# Patient Record
Sex: Male | Born: 1950
Health system: Southern US, Community
[De-identification: ages and names within clinical notes are randomized; demographics above are authoritative.]

## PROBLEM LIST (undated history)

## (undated) DIAGNOSIS — Z9289 Personal history of other medical treatment: Secondary | ICD-10-CM

## (undated) DIAGNOSIS — Z86010 Personal history of colonic polyps: Secondary | ICD-10-CM

## (undated) DIAGNOSIS — I2510 Atherosclerotic heart disease of native coronary artery without angina pectoris: Principal | ICD-10-CM

## (undated) DIAGNOSIS — I700 Atherosclerosis of aorta: Secondary | ICD-10-CM

## (undated) DIAGNOSIS — E785 Hyperlipidemia, unspecified: Secondary | ICD-10-CM

## (undated) HISTORY — DX: Personal history of other medical treatment: Z92.89

## (undated) HISTORY — DX: Hyperlipidemia, unspecified: E78.5

## (undated) HISTORY — DX: Atherosclerosis of aorta: I70.0

## (undated) HISTORY — DX: Personal history of colonic polyps: Z86.010

## (undated) HISTORY — DX: Atherosclerotic heart disease of native coronary artery without angina pectoris: I25.10

---

## 2015-01-31 ENCOUNTER — Other Ambulatory Visit: Payer: Self-pay | Admitting: Family Medicine

## 2015-01-31 DIAGNOSIS — Z136 Encounter for screening for cardiovascular disorders: Secondary | ICD-10-CM

## 2015-03-02 ENCOUNTER — Telehealth: Payer: Self-pay | Admitting: *Deleted

## 2015-03-02 NOTE — Telephone Encounter (Signed)
Left Message to make Appointment x3 Sent letter  Dear Fred Figueroa, We have attempted to call you several times to schedule the lung screening Dr. London Pepper requested you have performed. We have been unable to contact you by phone. Please call the number below at your earliest convenience so that we can get you scheduled for your screening. We look forward to participating in your care.  Thank you,  [facility] [phone]

## 2016-02-08 ENCOUNTER — Other Ambulatory Visit: Payer: Self-pay | Admitting: Family Medicine

## 2016-02-08 DIAGNOSIS — Z87891 Personal history of nicotine dependence: Secondary | ICD-10-CM

## 2016-02-25 ENCOUNTER — Ambulatory Visit
Admission: RE | Admit: 2016-02-25 | Discharge: 2016-02-25 | Disposition: A | Discharge status: Home / Self Care | Payer: Medicare Other | Source: Ambulatory Visit | Attending: Family Medicine | Admitting: Family Medicine

## 2016-02-25 DIAGNOSIS — Z87891 Personal history of nicotine dependence: Secondary | ICD-10-CM

## 2016-03-12 ENCOUNTER — Telehealth: Payer: Self-pay | Admitting: Acute Care

## 2016-03-12 DIAGNOSIS — Z87891 Personal history of nicotine dependence: Secondary | ICD-10-CM

## 2016-03-13 NOTE — Telephone Encounter (Signed)
Pt returning call and can be reached @ [phone].Fred Figueroa

## 2016-03-13 NOTE — Telephone Encounter (Signed)
Spoke with pt and scheduled for [facility] 04/02/16 9:00 CT ordered Nothing further needed

## 2016-04-02 ENCOUNTER — Encounter: Payer: Self-pay | Admitting: Acute Care

## 2016-04-02 ENCOUNTER — Ambulatory Visit (INDEPENDENT_AMBULATORY_CARE_PROVIDER_SITE_OTHER)
Admission: RE | Admit: 2016-04-02 | Discharge: 2016-04-02 | Disposition: A | Discharge status: Home / Self Care | Payer: Medicare Other | Source: Ambulatory Visit | Attending: Acute Care | Admitting: Acute Care

## 2016-04-02 ENCOUNTER — Ambulatory Visit (INDEPENDENT_AMBULATORY_CARE_PROVIDER_SITE_OTHER): Payer: Medicare Other | Admitting: Acute Care

## 2016-04-02 DIAGNOSIS — Z87891 Personal history of nicotine dependence: Secondary | ICD-10-CM

## 2016-04-02 NOTE — Progress Notes (Signed)
Shared Decision Making Visit Lung Cancer Screening Program [phone])   Eligibility:  Age 66 y.o.  Pack Years Smoking History Calculation 43 pack year smoking history (# packs/per year x # years smoked)  Recent History of coughing up blood  no  Unexplained weight loss? no ( >Than 15 pounds within the last 6 months )  Prior History Lung / other cancer no (Diagnosis within the last 5 years already requiring surveillance chest CT Scans).  Smoking Status Former Smoker  Former Smokers: Years since quit: 2 years  Quit Date: 05/2014  Visit Components:  Discussion included one or more decision making aids. yes  Discussion included risk/benefits of screening. yes  Discussion included potential follow up diagnostic testing for abnormal scans. yes  Discussion included meaning and risk of over diagnosis. yes  Discussion included meaning and risk of False Positives. yes  Discussion included meaning of total radiation exposure. yes  Counseling Included:  Importance of adherence to annual lung cancer LDCT screening. yes  Impact of comorbidities on ability to participate in the program. yes  Ability and willingness to under diagnostic treatment. yes  Smoking Cessation Counseling:  Current Smokers:   Discussed importance of smoking cessation. yes  Information about tobacco cessation classes and interventions provided to patient. yes  Patient provided with "ticket" for LDCT Scan. yes  Symptomatic Patient. no  Counseling  Diagnosis Code: Tobacco Use Z72.0  Asymptomatic Patient yes  Counseling (Intermediate counseling: > three minutes counseling) M6294  Former Smokers:   Discussed the importance of maintaining cigarette abstinence. yes  Diagnosis Code: Personal History of Nicotine Dependence. T65.465  Information about tobacco cessation classes and interventions provided to patient. Yes  Patient provided with "ticket" for LDCT Scan. yes  Written Order for Lung Cancer  Screening with LDCT placed in Epic. Yes (CT Chest Lung Cancer Screening Low Dose W/O CM) [id number] Z12.2-Screening of respiratory organs Z87.891-Personal history of nicotine dependence  I spent 25 minutes of face to face time with Mr. Malloy discussing the risks and benefits of lung cancer screening. We viewed a power point together that explained in detail the above noted topics. We took the time to pause the power point at intervals to allow for questions to be asked and answered to ensure understanding. We discussed that he had taken the single most powerful action possible to decrease his risk of developing lung cancer when he quit smoking. I counseled him to remain smoke free, and to contact me if he ever had the desire to smoke again so that I can provide resources and tools to help support the effort to remain smoke free. We discussed the time and location of the scan, and that either Leigh or I will call with the results within  24-48 hours of receiving them. He has my card and contact information in the event he needs to speak with me, in addition to a copy of the power point we reviewed as a resource. Mr. Pooley verbalized understanding of all of the above and had no further questions upon leaving the office.   We spen 2 minutes discussing the need to maintain smoking abstinence.  We discussed the high incidence of CAD as an incidental finding on this exam, and that as a non-gated exam degree or severity could not be determined. Mr. Reister has his cholesterol checked annually, but does not take a statin. I told him we would fax a result of the scan to his PCP for follow up. He verbalized understanding.  Magdalen Spatz, NP 04/02/2016

## 2016-04-03 ENCOUNTER — Other Ambulatory Visit: Payer: Self-pay | Admitting: Acute Care

## 2016-04-03 DIAGNOSIS — Z87891 Personal history of nicotine dependence: Secondary | ICD-10-CM

## 2016-04-10 ENCOUNTER — Telehealth: Payer: Self-pay

## 2016-04-10 NOTE — Telephone Encounter (Signed)
SENT NOTES TO SCHEDULING 

## 2016-04-11 ENCOUNTER — Telehealth: Payer: Self-pay | Admitting: *Deleted

## 2016-04-11 NOTE — Telephone Encounter (Signed)
NOTES SENT TO SCHEDULING.  °

## 2016-05-09 ENCOUNTER — Encounter: Payer: Self-pay | Admitting: Physician Assistant

## 2016-05-09 ENCOUNTER — Ambulatory Visit (INDEPENDENT_AMBULATORY_CARE_PROVIDER_SITE_OTHER): Payer: Medicare Other | Admitting: Physician Assistant

## 2016-05-09 VITALS — BP 130/80 | HR 66 | Ht 74.0 in | Wt 179.8 lb

## 2016-05-09 DIAGNOSIS — E785 Hyperlipidemia, unspecified: Secondary | ICD-10-CM | POA: Diagnosis not present

## 2016-05-09 DIAGNOSIS — I700 Atherosclerosis of aorta: Secondary | ICD-10-CM | POA: Diagnosis not present

## 2016-05-09 DIAGNOSIS — I2510 Atherosclerotic heart disease of native coronary artery without angina pectoris: Secondary | ICD-10-CM | POA: Diagnosis not present

## 2016-05-09 HISTORY — DX: Atherosclerosis of aorta: I70.0

## 2016-05-09 HISTORY — DX: Atherosclerotic heart disease of native coronary artery without angina pectoris: I25.10

## 2016-05-09 NOTE — Progress Notes (Signed)
Cardiology Office Note:    Date:  05/09/2016   ID:  Fred Figueroa, DOB 1950/04/08, MRN [id number]  PCP:  London Pepper, MD  Cardiologist:  New - Dr. Sherren Mocha / Richardson Dopp, PA-C    Referring MD: London Pepper, MD   Chief Complaint  Patient presents with  . Coronary Atherosclerosis on Chest CT    History of Present Illness:    Fred Figueroa is a 66 y.o. male who is being seen today for the evaluation of coronary atherosclerosis noted on recent Chest CT at the request of London Pepper, MD.   He is here alone.  He does not have a hx of coronary artery disease or congestive heart failure.  He is a former smoker and underwent lung CA screening that demonstrated coronary and aortic atherosclerosis.  He denies any hx of chest pain or shortness of breath.  He denies syncope, orthopnea, PND, edema.    PAD Screen 05/09/2016  Previous PAD dx? No  Previous surgical procedure? No  Pain with walking? No  Feet/toe relief with dangling? No  Painful, non-healing ulcers? No  Extremities discolored? No    Prior CV studies:   The following studies were reviewed today:  Chest CT 4/18 IMPRESSION: 1. Lung-RADS Category 2, benign appearance or behavior. Continue annual screening with low-dose chest CT without contrast in 12 months. 2.  Emphysema. (ICD10-J43.9) 3. Coronary artery and thoracic aortic atherosclerosis.  AAA [place] 2/18 IMPRESSION: Negative for an abdominal aortic aneurysm.    Past Medical History:  Diagnosis Date  . Aortic atherosclerosis ([place]) 05/09/2016  . Coronary artery calcification seen on CT scan 05/09/2016  . Hx of colonic polyps   . Hyperlipidemia     History reviewed. No pertinent surgical history.  Current Medications: Current Meds  Medication Sig  . aspirin EC 81 MG tablet Take 81 mg by mouth daily.  . Lactobacillus (PROBIOTIC ACIDOPHILUS PO) Take by mouth. TAKE ONE TABLET BY MOUTH ONCE DAILY (OTC)  . Multiple Vitamin (MULTIVITAMIN) tablet Take 1 tablet by  mouth daily.  Marland Kitchen omega-3 acid ethyl esters (LOVAZA) 1 g capsule Take 1 g by mouth daily.     Allergies:   Patient has no allergy information on record.   Social History   Social History  . Marital status: Married    Spouse name: N/A  . Number of children: 2  . Years of education: N/A   Occupational History  . Software Developer [employer]   Social History Main Topics  . Smoking status: Former Smoker    Packs/day: 1.00    Years: 43.00    Types: Cigarettes    Quit date: 06/2014  . Smokeless tobacco: Never Used  . Alcohol use Yes     Comment: minimal  . Drug use: No  . Sexual activity: Not Asked   Other Topics Concern  . None   Social History Narrative   Native of [employer] certified [employer]   [place]   Married, 2 kids     Family History  Problem Relation Age of Onset  . Pancreatic cancer Mother   . Alzheimer's disease Father   . Heart attack Neg Hx      ROS:   Please see the history of present illness.    ROS All other systems reviewed and are negative.   EKGs/Labs/Other Test Reviewed:    EKG:  EKG is  ordered today.  The ekg ordered today demonstrates NSR, HR 66, normal  axis, inc RBBB, QTc 404 ms  Recent Labs: No results found for requested labs within last 8760 hours.  BUN 22.000 02/04/2016 Creatinine, Serum 1.110 02/04/2016 Albumin, serum 4.000 02/04/2016 ALT (SGPT) 21.000 02/04/2016 AST (SGOT) 18.000 02/04/2016 Bilirubin, Total 0.600 02/04/2016 Calcium 9.100 02/04/2016 Chloride 107.000 02/04/2016 Non-HDL 130.000 02/04/2016 Potassium 4.500 02/04/2016 Protein, blood 6.700 02/04/2016 Sodium 140.000 02/04/2016  Recent Lipid Panel No results found for: CHOL, TRIG, HDL, CHOLHDL, VLDL, LDLCALC, LDLDIRECT Lipid Panel completed 02/04/2016 HDL 29.000 02/04/2016 LDL 108.000 02/04/2016 Cholesterol, total 159.000 02/04/2016  Physical Exam:    VS:  BP 130/80 (BP Location: Left Arm, Patient Position: Sitting)   Pulse 66    Ht 6\' 2"  (1.88 m)   Wt 179 lb 12.8 oz (81.6 kg)   BMI 23.08 kg/m     Wt Readings from Last 3 Encounters:  05/09/16 179 lb 12.8 oz (81.6 kg)     Physical Exam  Constitutional: He is oriented to person, place, and time. He appears well-developed and well-nourished. No distress.  HENT:  Head: Normocephalic and atraumatic.  Eyes: No scleral icterus.  Neck: Normal range of motion. No JVD present. Carotid bruit is not present.  Cardiovascular: Normal rate, regular rhythm, S1 normal and S2 normal.   No murmur heard. Pulses:      Dorsalis pedis pulses are 2+ on the right side, and 2+ on the left side.       Posterior tibial pulses are 2+ on the right side, and 2+ on the left side.  Pulmonary/Chest: Effort normal and breath sounds normal. He has no wheezes. He has no rhonchi. He has no rales.  Abdominal: Soft. There is no tenderness.  Musculoskeletal: He exhibits no edema.  Neurological: He is alert and oriented to person, place, and time.  Skin: Skin is warm and dry.  Psychiatric: He has a normal mood and affect.    ASSESSMENT:    1. Coronary artery calcification seen on CT scan   2. Aortic atherosclerosis ([place])   3. Hyperlipidemia, unspecified hyperlipidemia type    PLAN:    In order of problems listed above:  1. Coronary artery calcification seen on CT scan -  He has no hx of chest pain and his ECG is basically normal aside from an incomplete RBBB.  He has a long hx of smoking.  We discussed proceeding with a GXT to screen for ischemic heart disease.  His 10 year ASCVD risk is 16.2% and I have recommended starting statin Rx.  He is already on an ASA.  -  He will consider statin Rx and contact us if he decides to proceed  -  Continue ASA  -  Arrange GXT  -  FU as needed unless GXT abnormal.  2. Aortic atherosclerosis ([facility]) - Continue ASA.  Recommend statin Rx.  He will consider this.   3. Hyperlipidemia, unspecified hyperlipidemia type - As noted, I have recommended statin  Rx.  He will consider this.    Dispo:  Return depending upon test results, for w/ Dr. Burt Knack or  Richardson Dopp, PA-C.   Medication Adjustments/Labs and Tests Ordered: Current medicines are reviewed at length with the patient today.  Concerns regarding medicines are outlined above.   Orders Placed This Encounter  Procedures  . Exercise Tolerance Test  . EKG 12-Lead    Signed, Richardson Dopp, PA-C  05/09/2016 11:15 AM    [facility] [address], [place], [place]  [address] Phone: [phone]; Fax: [phone]

## 2016-05-09 NOTE — Patient Instructions (Addendum)
Medication Instructions:  If you decide to start cholesterol treatment with a statin, call us.  Labwork: None   Testing/Procedures: 1. Your physician has requested that you have an echocardiogram. Echocardiography is a painless test that uses sound waves to create images of your heart. It provides your doctor with information about the size and shape of your heart and how well your heart's chambers and valves are working. This procedure takes approximately one hour. There are no restrictions for this procedure.  Follow-Up: Dr. Sherren Mocha or Richardson Dopp, PA-C as needed depending upon test results.   Any Other Special Instructions Will Be Listed Below (If Applicable).  If you need a refill on your cardiac medications before your next appointment, please call your pharmacy.

## 2016-05-23 ENCOUNTER — Ambulatory Visit (INDEPENDENT_AMBULATORY_CARE_PROVIDER_SITE_OTHER): Payer: Medicare Other

## 2016-05-23 DIAGNOSIS — I2510 Atherosclerotic heart disease of native coronary artery without angina pectoris: Secondary | ICD-10-CM

## 2016-05-23 LAB — EXERCISE TOLERANCE TEST
CHL RATE OF PERCEIVED EXERTION: 15
CSEPED: 6 min
CSEPEW: 7 METS
CSEPHR: 93 %
Exercise duration (sec): 0 s
MPHR: 154 {beats}/min
Peak HR: 144 {beats}/min
Rest HR: 80 {beats}/min

## 2016-05-25 ENCOUNTER — Encounter: Payer: Self-pay | Admitting: Physician Assistant

## 2016-05-26 ENCOUNTER — Telehealth: Payer: Self-pay | Admitting: *Deleted

## 2016-05-26 NOTE — Telephone Encounter (Signed)
-----   Message from Liliane Shi, [place] sent at 05/25/2016  8:31 PM EDT ----- Please call the patient. The stress test is normal. Continue current treatment plan.  Please fax a copy of this study result to his PCP:  London Pepper, MD  Thanks! Richardson Dopp, PA-C    05/25/2016 8:30 PM

## 2016-05-26 NOTE — Telephone Encounter (Signed)
Pt notified of normal stress test results by phone with verbal understanding. Pt agreeable to continue on current Tx plan. I will fax a copy of results to PCP Dr. Orland Mustard. Pt thanked me for my call today.

## 2018-11-02 ENCOUNTER — Encounter: Payer: Self-pay | Admitting: Acute Care

## 2019-03-17 ENCOUNTER — Other Ambulatory Visit: Payer: Self-pay | Admitting: *Deleted

## 2019-03-17 DIAGNOSIS — Z87891 Personal history of nicotine dependence: Secondary | ICD-10-CM

## 2019-04-07 ENCOUNTER — Ambulatory Visit (INDEPENDENT_AMBULATORY_CARE_PROVIDER_SITE_OTHER)
Admission: RE | Admit: 2019-04-07 | Discharge: 2019-04-07 | Disposition: A | Discharge status: Home / Self Care | Payer: Medicare Other | Source: Ambulatory Visit | Attending: Acute Care | Admitting: Acute Care

## 2019-04-07 ENCOUNTER — Other Ambulatory Visit: Payer: Self-pay

## 2019-04-07 DIAGNOSIS — Z87891 Personal history of nicotine dependence: Secondary | ICD-10-CM

## 2019-04-12 NOTE — Progress Notes (Signed)
Please call patient and let them  know their  low dose Ct was read as a Lung RADS 2: nodules that are benign in appearance and behavior with a very low likelihood of becoming a clinically active cancer due to size or lack of growth. Recommendation per radiology is for a repeat LDCT in 12 months. .Please let them  know we will order and schedule their  annual screening scan for 03/2020. Please let them  know there was notation of CAD on their  scan.  Please remind the patient  that this is a non-gated exam therefore degree or severity of disease  cannot be determined. Please have them  follow up with their PCP regarding potential risk factor modification, dietary therapy or pharmacologic therapy if clinically indicated. Pt.  is not  currently on statin therapy. Please place order for annual  screening scan for  03/2020 and fax results to PCP. Thanks so much. 

## 2019-04-14 ENCOUNTER — Telehealth: Payer: Self-pay | Admitting: Acute Care

## 2019-04-14 DIAGNOSIS — Z87891 Personal history of nicotine dependence: Secondary | ICD-10-CM

## 2019-04-18 NOTE — Telephone Encounter (Signed)
Pt informed of CT results per Sarah Groce, NP.  PT verbalized understanding.  Copy sent to PCP.  Order placed for 1 yr f/u CT.  

## 2020-04-13 DIAGNOSIS — Z0000 Encounter for general adult medical examination without abnormal findings: Secondary | ICD-10-CM | POA: Diagnosis not present

## 2020-04-13 DIAGNOSIS — I700 Atherosclerosis of aorta: Secondary | ICD-10-CM | POA: Diagnosis not present

## 2020-04-13 DIAGNOSIS — E785 Hyperlipidemia, unspecified: Secondary | ICD-10-CM | POA: Diagnosis not present

## 2020-05-17 ENCOUNTER — Ambulatory Visit (INDEPENDENT_AMBULATORY_CARE_PROVIDER_SITE_OTHER)
Admission: RE | Admit: 2020-05-17 | Discharge: 2020-05-17 | Disposition: A | Discharge status: Home / Self Care | Payer: Medicare Other | Source: Ambulatory Visit | Attending: Family Medicine | Admitting: Family Medicine

## 2020-05-17 ENCOUNTER — Other Ambulatory Visit: Payer: Self-pay

## 2020-05-17 DIAGNOSIS — Z87891 Personal history of nicotine dependence: Secondary | ICD-10-CM

## 2020-05-27 NOTE — Progress Notes (Signed)
Please call patient and let them  know their  low dose Ct was read as a Lung RADS 2: nodules that are benign in appearance and behavior with a very low likelihood of becoming a clinically active cancer due to size or lack of growth. Recommendation per radiology is for a repeat LDCT in 12 months. Please let them  know we will order and schedule their  annual screening scan for 04/2021. Please let them  know there was notation of CAD on their  scan.  Please remind the patient  that this is a non-gated exam therefore degree or severity of disease  cannot be determined. Please have them  follow up with their PCP regarding potential risk factor modification, dietary therapy or pharmacologic therapy if clinically indicated. Pt.  is not  currently on statin therapy. Please place order for annual  screening scan for  04/2021 and fax results to PCP. Thanks so much. 

## 2020-05-29 ENCOUNTER — Other Ambulatory Visit: Payer: Self-pay | Admitting: *Deleted

## 2020-05-29 DIAGNOSIS — Z87891 Personal history of nicotine dependence: Secondary | ICD-10-CM

## 2020-12-27 DIAGNOSIS — D225 Melanocytic nevi of trunk: Secondary | ICD-10-CM | POA: Diagnosis not present

## 2020-12-27 DIAGNOSIS — L814 Other melanin hyperpigmentation: Secondary | ICD-10-CM | POA: Diagnosis not present

## 2020-12-27 DIAGNOSIS — Z08 Encounter for follow-up examination after completed treatment for malignant neoplasm: Secondary | ICD-10-CM | POA: Diagnosis not present

## 2020-12-27 DIAGNOSIS — L820 Inflamed seborrheic keratosis: Secondary | ICD-10-CM | POA: Diagnosis not present

## 2020-12-27 DIAGNOSIS — L298 Other pruritus: Secondary | ICD-10-CM | POA: Diagnosis not present

## 2020-12-27 DIAGNOSIS — B351 Tinea unguium: Secondary | ICD-10-CM | POA: Diagnosis not present

## 2020-12-27 DIAGNOSIS — Z85828 Personal history of other malignant neoplasm of skin: Secondary | ICD-10-CM | POA: Diagnosis not present

## 2020-12-27 DIAGNOSIS — L570 Actinic keratosis: Secondary | ICD-10-CM | POA: Diagnosis not present

## 2020-12-27 DIAGNOSIS — L538 Other specified erythematous conditions: Secondary | ICD-10-CM | POA: Diagnosis not present

## 2020-12-27 DIAGNOSIS — L821 Other seborrheic keratosis: Secondary | ICD-10-CM | POA: Diagnosis not present

## 2021-04-29 DIAGNOSIS — Z87891 Personal history of nicotine dependence: Secondary | ICD-10-CM | POA: Diagnosis not present

## 2021-04-29 DIAGNOSIS — Z23 Encounter for immunization: Secondary | ICD-10-CM | POA: Diagnosis not present

## 2021-04-29 DIAGNOSIS — Z0000 Encounter for general adult medical examination without abnormal findings: Secondary | ICD-10-CM | POA: Diagnosis not present

## 2021-04-29 DIAGNOSIS — I700 Atherosclerosis of aorta: Secondary | ICD-10-CM | POA: Diagnosis not present

## 2021-04-29 DIAGNOSIS — J439 Emphysema, unspecified: Secondary | ICD-10-CM | POA: Diagnosis not present

## 2021-04-29 DIAGNOSIS — E785 Hyperlipidemia, unspecified: Secondary | ICD-10-CM | POA: Diagnosis not present

## 2021-05-17 ENCOUNTER — Ambulatory Visit (INDEPENDENT_AMBULATORY_CARE_PROVIDER_SITE_OTHER)
Admission: RE | Admit: 2021-05-17 | Discharge: 2021-05-17 | Disposition: A | Discharge status: Home / Self Care | Payer: Medicare Other | Source: Ambulatory Visit | Attending: Acute Care | Admitting: Acute Care

## 2021-05-17 DIAGNOSIS — Z87891 Personal history of nicotine dependence: Secondary | ICD-10-CM | POA: Diagnosis not present

## 2021-05-20 ENCOUNTER — Other Ambulatory Visit: Payer: Self-pay

## 2021-05-20 DIAGNOSIS — Z122 Encounter for screening for malignant neoplasm of respiratory organs: Secondary | ICD-10-CM

## 2021-05-20 DIAGNOSIS — Z87891 Personal history of nicotine dependence: Secondary | ICD-10-CM

## 2021-08-23 DIAGNOSIS — D649 Anemia, unspecified: Secondary | ICD-10-CM | POA: Diagnosis not present

## 2021-12-18 DIAGNOSIS — H65192 Other acute nonsuppurative otitis media, left ear: Secondary | ICD-10-CM | POA: Diagnosis not present

## 2021-12-18 DIAGNOSIS — H6691 Otitis media, unspecified, right ear: Secondary | ICD-10-CM | POA: Diagnosis not present

## 2021-12-18 DIAGNOSIS — H6121 Impacted cerumen, right ear: Secondary | ICD-10-CM | POA: Diagnosis not present

## 2021-12-26 DIAGNOSIS — S00411A Abrasion of right ear, initial encounter: Secondary | ICD-10-CM | POA: Diagnosis not present

## 2021-12-30 DIAGNOSIS — D225 Melanocytic nevi of trunk: Secondary | ICD-10-CM | POA: Diagnosis not present

## 2021-12-30 DIAGNOSIS — L821 Other seborrheic keratosis: Secondary | ICD-10-CM | POA: Diagnosis not present

## 2021-12-30 DIAGNOSIS — L570 Actinic keratosis: Secondary | ICD-10-CM | POA: Diagnosis not present

## 2021-12-30 DIAGNOSIS — L814 Other melanin hyperpigmentation: Secondary | ICD-10-CM | POA: Diagnosis not present

## 2021-12-30 DIAGNOSIS — Z08 Encounter for follow-up examination after completed treatment for malignant neoplasm: Secondary | ICD-10-CM | POA: Diagnosis not present

## 2021-12-30 DIAGNOSIS — Z85828 Personal history of other malignant neoplasm of skin: Secondary | ICD-10-CM | POA: Diagnosis not present

## 2022-03-13 DIAGNOSIS — H5203 Hypermetropia, bilateral: Secondary | ICD-10-CM | POA: Diagnosis not present

## 2022-03-29 ENCOUNTER — Other Ambulatory Visit: Payer: Self-pay | Admitting: Acute Care

## 2022-03-29 DIAGNOSIS — Z122 Encounter for screening for malignant neoplasm of respiratory organs: Secondary | ICD-10-CM

## 2022-03-29 DIAGNOSIS — Z87891 Personal history of nicotine dependence: Secondary | ICD-10-CM

## 2022-04-01 ENCOUNTER — Other Ambulatory Visit: Payer: Self-pay | Admitting: Family Medicine

## 2022-04-01 ENCOUNTER — Ambulatory Visit
Admission: RE | Admit: 2022-04-01 | Discharge: 2022-04-01 | Disposition: A | Discharge status: Home / Self Care | Payer: Medicare Other | Source: Ambulatory Visit | Attending: Family Medicine | Admitting: Family Medicine

## 2022-04-01 DIAGNOSIS — R059 Cough, unspecified: Secondary | ICD-10-CM | POA: Diagnosis not present

## 2022-04-01 DIAGNOSIS — R0602 Shortness of breath: Secondary | ICD-10-CM | POA: Diagnosis not present

## 2022-04-01 DIAGNOSIS — R7981 Abnormal blood-gas level: Secondary | ICD-10-CM | POA: Diagnosis not present

## 2022-04-01 DIAGNOSIS — R0981 Nasal congestion: Secondary | ICD-10-CM | POA: Diagnosis not present

## 2022-04-01 DIAGNOSIS — Z03818 Encounter for observation for suspected exposure to other biological agents ruled out: Secondary | ICD-10-CM | POA: Diagnosis not present

## 2022-04-08 DIAGNOSIS — J449 Chronic obstructive pulmonary disease, unspecified: Secondary | ICD-10-CM | POA: Diagnosis not present

## 2022-04-08 DIAGNOSIS — J439 Emphysema, unspecified: Secondary | ICD-10-CM | POA: Diagnosis not present

## 2022-05-15 DIAGNOSIS — Z87891 Personal history of nicotine dependence: Secondary | ICD-10-CM | POA: Diagnosis not present

## 2022-05-15 DIAGNOSIS — E785 Hyperlipidemia, unspecified: Secondary | ICD-10-CM | POA: Diagnosis not present

## 2022-05-15 DIAGNOSIS — J439 Emphysema, unspecified: Secondary | ICD-10-CM | POA: Diagnosis not present

## 2022-05-15 DIAGNOSIS — I2510 Atherosclerotic heart disease of native coronary artery without angina pectoris: Secondary | ICD-10-CM | POA: Diagnosis not present

## 2022-05-15 DIAGNOSIS — I700 Atherosclerosis of aorta: Secondary | ICD-10-CM | POA: Diagnosis not present

## 2022-05-15 DIAGNOSIS — Z0000 Encounter for general adult medical examination without abnormal findings: Secondary | ICD-10-CM | POA: Diagnosis not present

## 2022-05-21 ENCOUNTER — Ambulatory Visit
Admission: RE | Admit: 2022-05-21 | Discharge: 2022-05-21 | Disposition: A | Discharge status: Home / Self Care | Payer: Medicare Other | Source: Ambulatory Visit

## 2022-05-21 DIAGNOSIS — I700 Atherosclerosis of aorta: Secondary | ICD-10-CM | POA: Diagnosis not present

## 2022-05-21 DIAGNOSIS — Z122 Encounter for screening for malignant neoplasm of respiratory organs: Secondary | ICD-10-CM

## 2022-05-21 DIAGNOSIS — Z87891 Personal history of nicotine dependence: Secondary | ICD-10-CM

## 2022-05-21 DIAGNOSIS — J439 Emphysema, unspecified: Secondary | ICD-10-CM | POA: Diagnosis not present

## 2022-05-26 ENCOUNTER — Other Ambulatory Visit: Payer: Self-pay

## 2022-05-26 DIAGNOSIS — Z87891 Personal history of nicotine dependence: Secondary | ICD-10-CM

## 2022-05-26 DIAGNOSIS — Z122 Encounter for screening for malignant neoplasm of respiratory organs: Secondary | ICD-10-CM

## 2023-01-21 IMAGING — CT CT CHEST LUNG CANCER SCREENING LOW DOSE W/O CM
2 of 4 series · 15 of 36 positions shown, 18 images · non-contrast
Comparison: 05/17/2020.

CLINICAL DATA: Former smoker, quit May 2014, 43 pack-year history.



[Series 3: lung thins 1.0 · axial · 0.72mm/px · z∈[-332,+12]mm · 12 of 380 slices shown, 15 images]
[im 18/380  mediastinal]
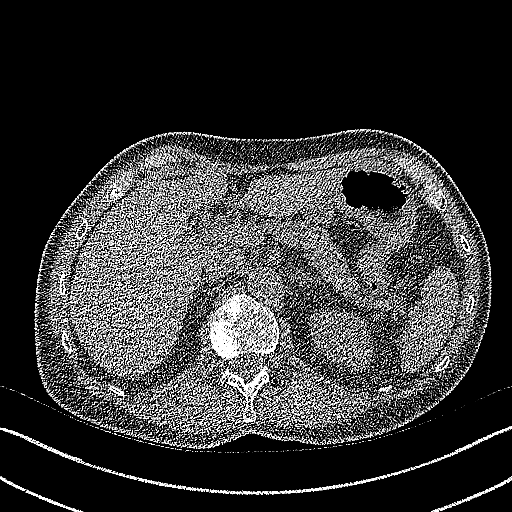
[im 18/380  lung]
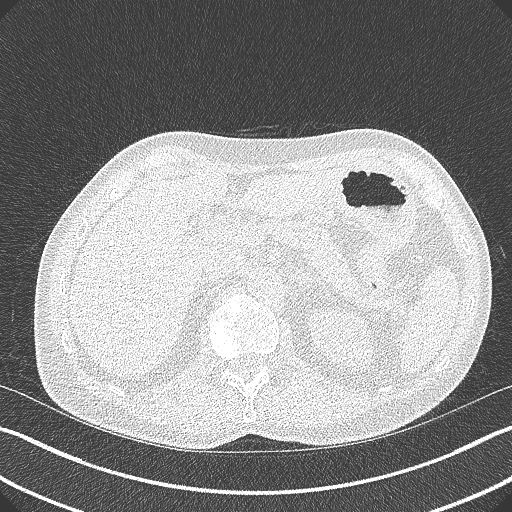
[im 52/380  lung]
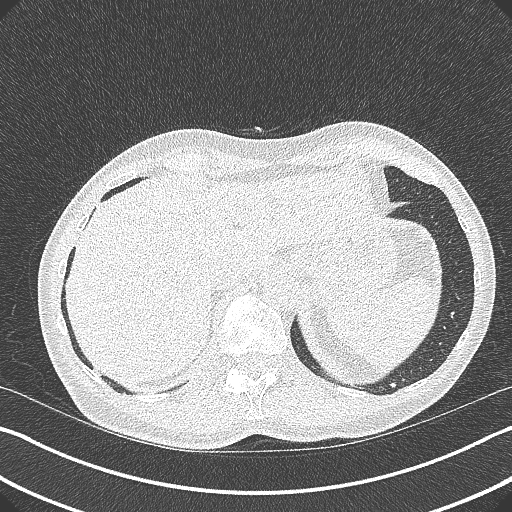
[im 87/380  lung]
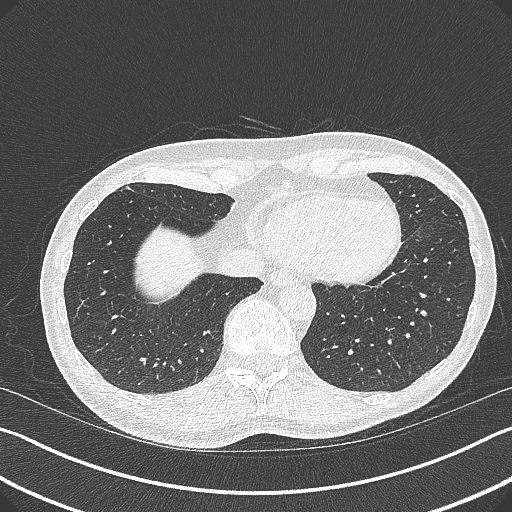
[im 121/380  lung]
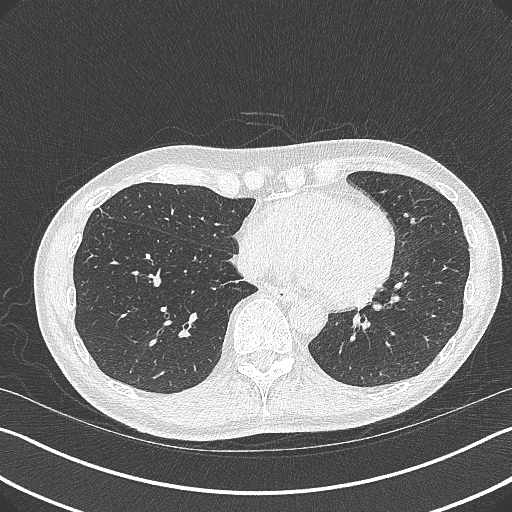
[im 138/380  mediastinal]
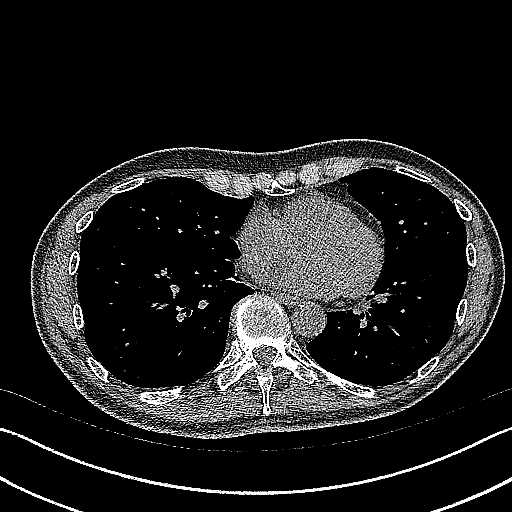
[im 138/380  lung]
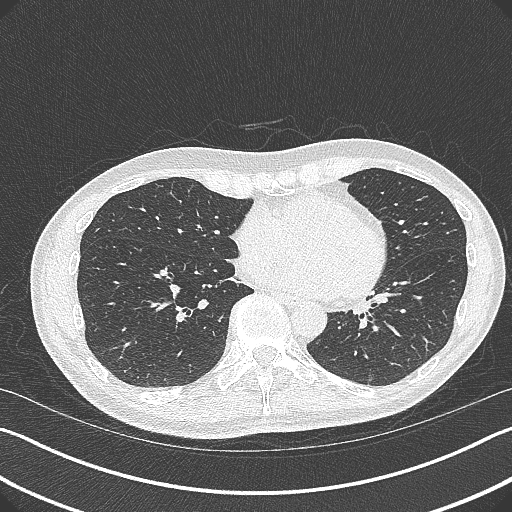
[im 173/380  lung]
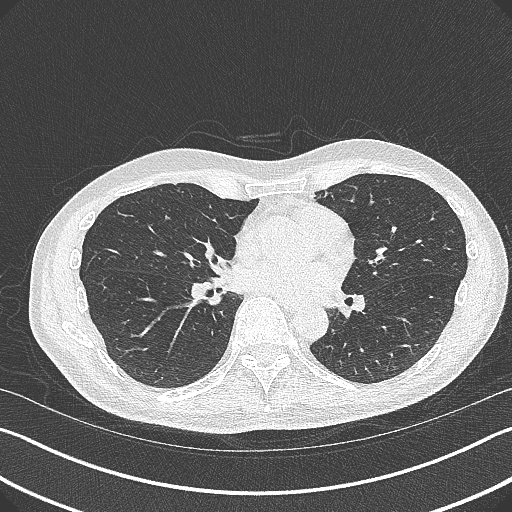
[im 207/380  lung]
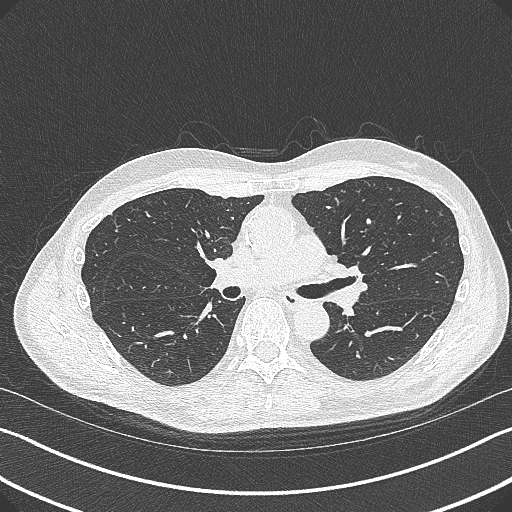
[im 242/380  lung]
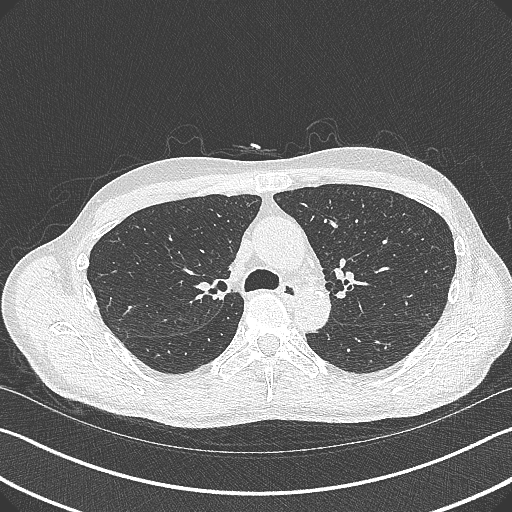
[im 259/380  mediastinal]
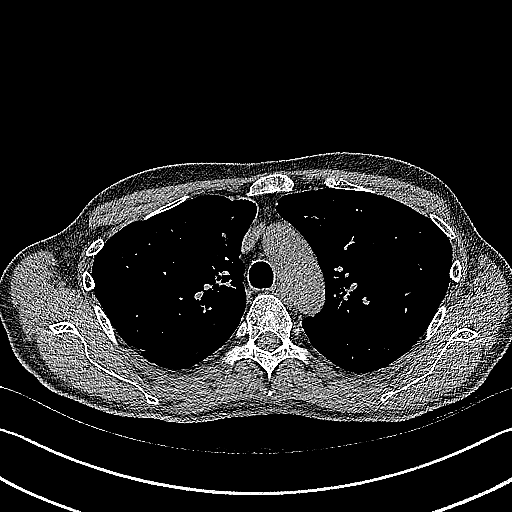
[im 259/380  lung]
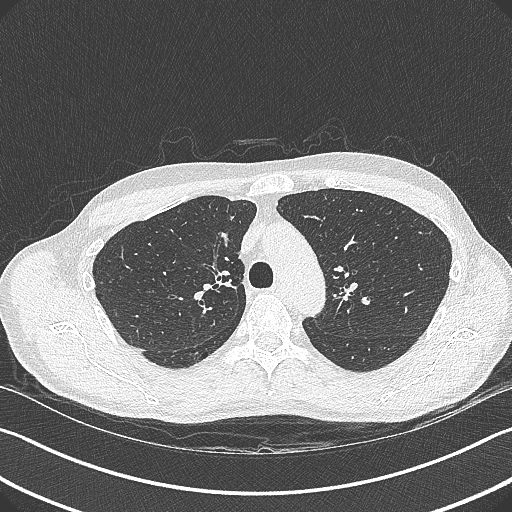
[im 293/380  lung]
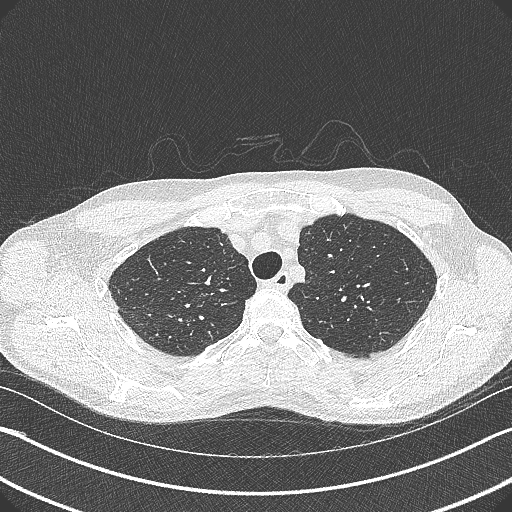
[im 328/380  lung]
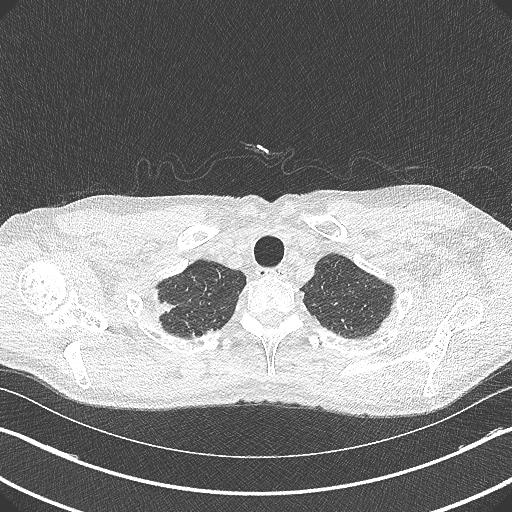
[im 362/380  lung]
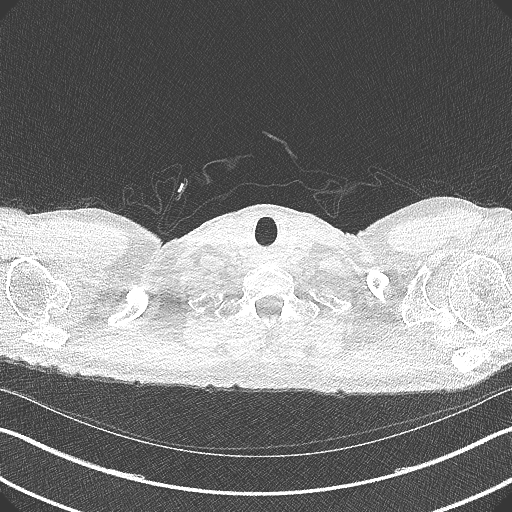

[Series 5: coronal · coronal · 0.68mm/px · 3 of 97 slices shown]
[im 20/97  lung]
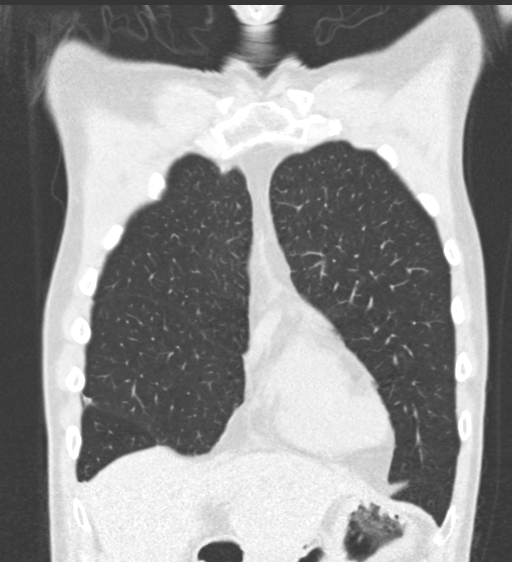
[im 39/97  lung]
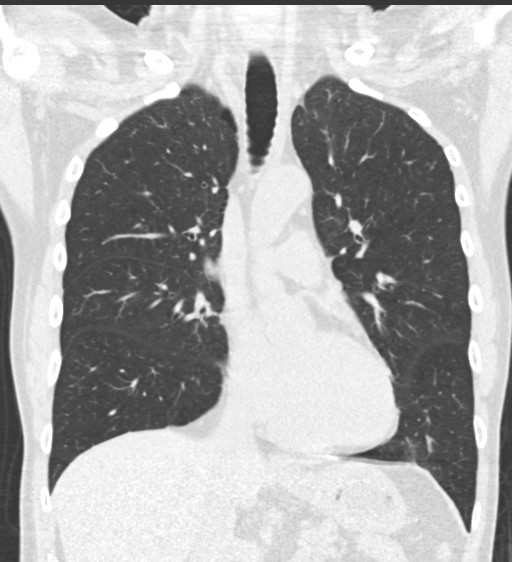
[im 58/97  lung]
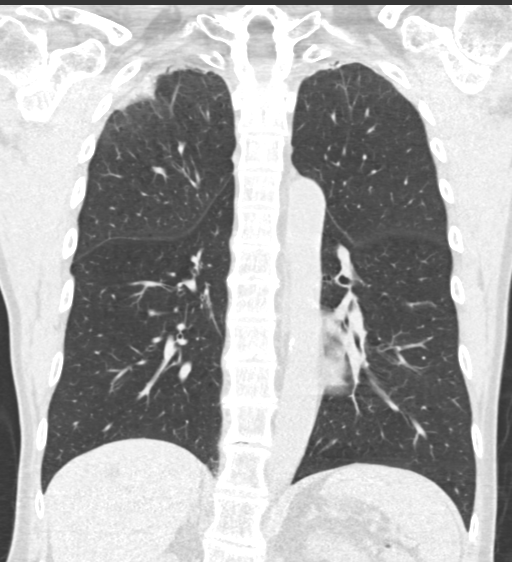

[15 of 36 positions shown; findings below may reference images not displayed]

FINDINGS: Cardiovascular: Atherosclerotic calcification of the aorta. Heart
size normal. No pericardial effusion.

Mediastinum/Nodes: No pathologically enlarged mediastinal or
axillary lymph nodes. Hilar regions are difficult to definitively
evaluate without IV contrast. Esophagus is grossly unremarkable.

Lungs/Pleura: Biapical pleuroparenchymal scarring. Centrilobular and
paraseptal emphysema. Pulmonary nodules measure 5.2 mm or less in
size, as before. No new or suspicious pulmonary nodules. No pleural
fluid. Airway is unremarkable.

Upper Abdomen: Low-attenuation lesions in the liver measure up to
2.4 cm, similar and likely cysts. Visualized portions of the liver,
adrenal glands and right kidney are unremarkable. Punctate left
renal stone. Visualized portions of the spleen, pancreas, stomach
and bowel are grossly unremarkable.

Musculoskeletal: No worrisome lytic or sclerotic lesions.
IMPRESSION: 1. Lung-RADS 2, benign appearance or behavior. Continue annual
screening with low-dose chest CT without contrast in 12 months.
2. Punctate left renal stone.
3. Aortic atherosclerosis ([id number].A). Coronary artery
calcification.
4.  Emphysema ([id number]).

## 2023-01-27 DIAGNOSIS — L814 Other melanin hyperpigmentation: Secondary | ICD-10-CM | POA: Diagnosis not present

## 2023-01-27 DIAGNOSIS — L570 Actinic keratosis: Secondary | ICD-10-CM | POA: Diagnosis not present

## 2023-01-27 DIAGNOSIS — L820 Inflamed seborrheic keratosis: Secondary | ICD-10-CM | POA: Diagnosis not present

## 2023-01-27 DIAGNOSIS — L538 Other specified erythematous conditions: Secondary | ICD-10-CM | POA: Diagnosis not present

## 2023-01-27 DIAGNOSIS — L821 Other seborrheic keratosis: Secondary | ICD-10-CM | POA: Diagnosis not present

## 2023-01-27 DIAGNOSIS — Z85828 Personal history of other malignant neoplasm of skin: Secondary | ICD-10-CM | POA: Diagnosis not present

## 2023-01-27 DIAGNOSIS — D225 Melanocytic nevi of trunk: Secondary | ICD-10-CM | POA: Diagnosis not present

## 2023-01-27 DIAGNOSIS — Z08 Encounter for follow-up examination after completed treatment for malignant neoplasm: Secondary | ICD-10-CM | POA: Diagnosis not present

## 2023-06-02 ENCOUNTER — Other Ambulatory Visit: Payer: Self-pay | Admitting: Acute Care

## 2023-06-02 DIAGNOSIS — Z122 Encounter for screening for malignant neoplasm of respiratory organs: Secondary | ICD-10-CM

## 2023-06-02 DIAGNOSIS — Z87891 Personal history of nicotine dependence: Secondary | ICD-10-CM

## 2023-06-04 DIAGNOSIS — E785 Hyperlipidemia, unspecified: Secondary | ICD-10-CM | POA: Diagnosis not present

## 2023-06-04 DIAGNOSIS — I2510 Atherosclerotic heart disease of native coronary artery without angina pectoris: Secondary | ICD-10-CM | POA: Diagnosis not present

## 2023-06-04 DIAGNOSIS — J439 Emphysema, unspecified: Secondary | ICD-10-CM | POA: Diagnosis not present

## 2023-06-04 DIAGNOSIS — R7309 Other abnormal glucose: Secondary | ICD-10-CM | POA: Diagnosis not present

## 2023-06-04 DIAGNOSIS — Z0000 Encounter for general adult medical examination without abnormal findings: Secondary | ICD-10-CM | POA: Diagnosis not present

## 2023-06-04 DIAGNOSIS — Z87891 Personal history of nicotine dependence: Secondary | ICD-10-CM | POA: Diagnosis not present

## 2023-06-12 ENCOUNTER — Ambulatory Visit

## 2023-06-18 ENCOUNTER — Encounter: Payer: Self-pay | Admitting: Acute Care

## 2023-07-16 ENCOUNTER — Ambulatory Visit
Admission: RE | Admit: 2023-07-16 | Discharge: 2023-07-16 | Disposition: A | Discharge status: Home / Self Care | Source: Ambulatory Visit | Attending: Acute Care | Admitting: Acute Care

## 2023-07-16 DIAGNOSIS — Z122 Encounter for screening for malignant neoplasm of respiratory organs: Secondary | ICD-10-CM

## 2023-07-16 DIAGNOSIS — Z87891 Personal history of nicotine dependence: Secondary | ICD-10-CM | POA: Diagnosis not present

## 2023-07-27 ENCOUNTER — Other Ambulatory Visit: Payer: Self-pay

## 2023-07-27 DIAGNOSIS — Z87891 Personal history of nicotine dependence: Secondary | ICD-10-CM

## 2023-07-27 DIAGNOSIS — Z122 Encounter for screening for malignant neoplasm of respiratory organs: Secondary | ICD-10-CM
